# Patient Record
Sex: Male | Born: 1975 | Race: White | Hispanic: No | Marital: Single | Smoking: Current every day smoker
Health system: Southern US, Community
[De-identification: ages and names within clinical notes are randomized; demographics above are authoritative.]

---

## 1999-05-17 ENCOUNTER — Emergency Department (HOSPITAL_COMMUNITY): Admission: EM | Admit: 1999-05-17 | Discharge: 1999-05-17 | Payer: Self-pay | Admitting: *Deleted

## 1999-07-10 ENCOUNTER — Emergency Department (HOSPITAL_COMMUNITY): Admission: EM | Admit: 1999-07-10 | Discharge: 1999-07-11 | Payer: Self-pay | Admitting: Internal Medicine

## 1999-07-10 ENCOUNTER — Encounter: Payer: Self-pay | Admitting: Internal Medicine

## 2004-05-08 ENCOUNTER — Emergency Department (HOSPITAL_COMMUNITY): Admission: EM | Admit: 2004-05-08 | Discharge: 2004-05-08 | Payer: Self-pay | Admitting: Emergency Medicine

## 2016-07-31 ENCOUNTER — Encounter (HOSPITAL_COMMUNITY): Payer: Self-pay

## 2016-07-31 ENCOUNTER — Emergency Department (HOSPITAL_COMMUNITY)
Admission: EM | Admit: 2016-07-31 | Discharge: 2016-08-01 | Disposition: A | Discharge status: Home / Self Care | Payer: No Typology Code available for payment source | Attending: Emergency Medicine | Admitting: Emergency Medicine

## 2016-07-31 ENCOUNTER — Emergency Department (HOSPITAL_COMMUNITY): Payer: No Typology Code available for payment source

## 2016-07-31 DIAGNOSIS — S41011A Laceration without foreign body of right shoulder, initial encounter: Secondary | ICD-10-CM | POA: Insufficient documentation

## 2016-07-31 DIAGNOSIS — F17210 Nicotine dependence, cigarettes, uncomplicated: Secondary | ICD-10-CM | POA: Diagnosis not present

## 2016-07-31 DIAGNOSIS — S41111A Laceration without foreign body of right upper arm, initial encounter: Secondary | ICD-10-CM | POA: Insufficient documentation

## 2016-07-31 DIAGNOSIS — Y92410 Unspecified street and highway as the place of occurrence of the external cause: Secondary | ICD-10-CM | POA: Insufficient documentation

## 2016-07-31 DIAGNOSIS — S51812A Laceration without foreign body of left forearm, initial encounter: Secondary | ICD-10-CM | POA: Insufficient documentation

## 2016-07-31 DIAGNOSIS — Y999 Unspecified external cause status: Secondary | ICD-10-CM | POA: Insufficient documentation

## 2016-07-31 DIAGNOSIS — S41112A Laceration without foreign body of left upper arm, initial encounter: Secondary | ICD-10-CM

## 2016-07-31 DIAGNOSIS — Y9301 Activity, walking, marching and hiking: Secondary | ICD-10-CM | POA: Insufficient documentation

## 2016-07-31 MED ORDER — HYDROMORPHONE HCL 2 MG/ML IJ SOLN
1.0000 mg | INTRAMUSCULAR | Status: DC | PRN
  Administered 2016-07-31: 1 mg via INTRAVENOUS
  Filled 2016-07-31: qty 1

## 2016-07-31 MED ORDER — HYDROCODONE-ACETAMINOPHEN 5-325 MG PO TABS: 1.0000 | ORAL_TABLET | Freq: Four times a day (QID) | ORAL | 0 refills | Status: AC | PRN

## 2016-07-31 MED ORDER — CEPHALEXIN 500 MG PO CAPS: 500.0000 mg | ORAL_CAPSULE | Freq: Two times a day (BID) | ORAL | 0 refills | Status: AC

## 2016-07-31 MED ORDER — LIDOCAINE-EPINEPHRINE-TETRACAINE (LET) SOLUTION: 3.0000 mL | Freq: Once | NASAL | Status: DC

## 2016-07-31 MED ORDER — LIDOCAINE-EPINEPHRINE (PF) 2 %-1:200000 IJ SOLN: 10.0000 mL | Freq: Once | INTRAMUSCULAR | Status: DC

## 2016-07-31 MED ORDER — BUPIVACAINE-EPINEPHRINE (PF) 0.25% -1:200000 IJ SOLN
30.0000 mL | Freq: Once | INTRAMUSCULAR | Status: AC
  Administered 2016-07-31: 30 mL
  Filled 2016-07-31: qty 30

## 2016-07-31 MED ORDER — TETANUS-DIPHTH-ACELL PERTUSSIS 5-2.5-18.5 LF-MCG/0.5 IM SUSP
0.5000 mL | Freq: Once | INTRAMUSCULAR | Status: AC
  Administered 2016-07-31: 0.5 mL via INTRAMUSCULAR
  Filled 2016-07-31: qty 0.5

## 2016-07-31 MED ORDER — OXYCODONE-ACETAMINOPHEN 7.5-325 MG PO TABS
1.0000 | ORAL_TABLET | Freq: Once | ORAL | Status: AC
  Administered 2016-07-31: 1 via ORAL
  Filled 2016-07-31: qty 1

## 2016-07-31 MED ORDER — BACITRACIN ZINC 500 UNIT/GM EX OINT
TOPICAL_OINTMENT | Freq: Once | CUTANEOUS | Status: AC
  Administered 2016-07-31: 1 via TOPICAL
  Filled 2016-07-31: qty 2.7

## 2016-07-31 NOTE — Discharge Instructions (Signed)
Mr. Laurell JosephsBurke Please take your pain medication as needed Please take Keflex twice a day for 5 days Please watch for signs of infection and return if you have fever/chills or increased pain at site  Please go to an urgent care, primary care doctor or ER to have your stiches removed in 10 days
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
## 2017-08-26 IMAGING — CR DG ELBOW COMPLETE 3+V*R*
4 series · 4 of 4 positions shown · non-contrast
Comparison: None.

CLINICAL DATA: Trauma, laceration

EXAM:
RIGHT ELBOW - COMPLETE 3+ VIEW

[elbow ap]
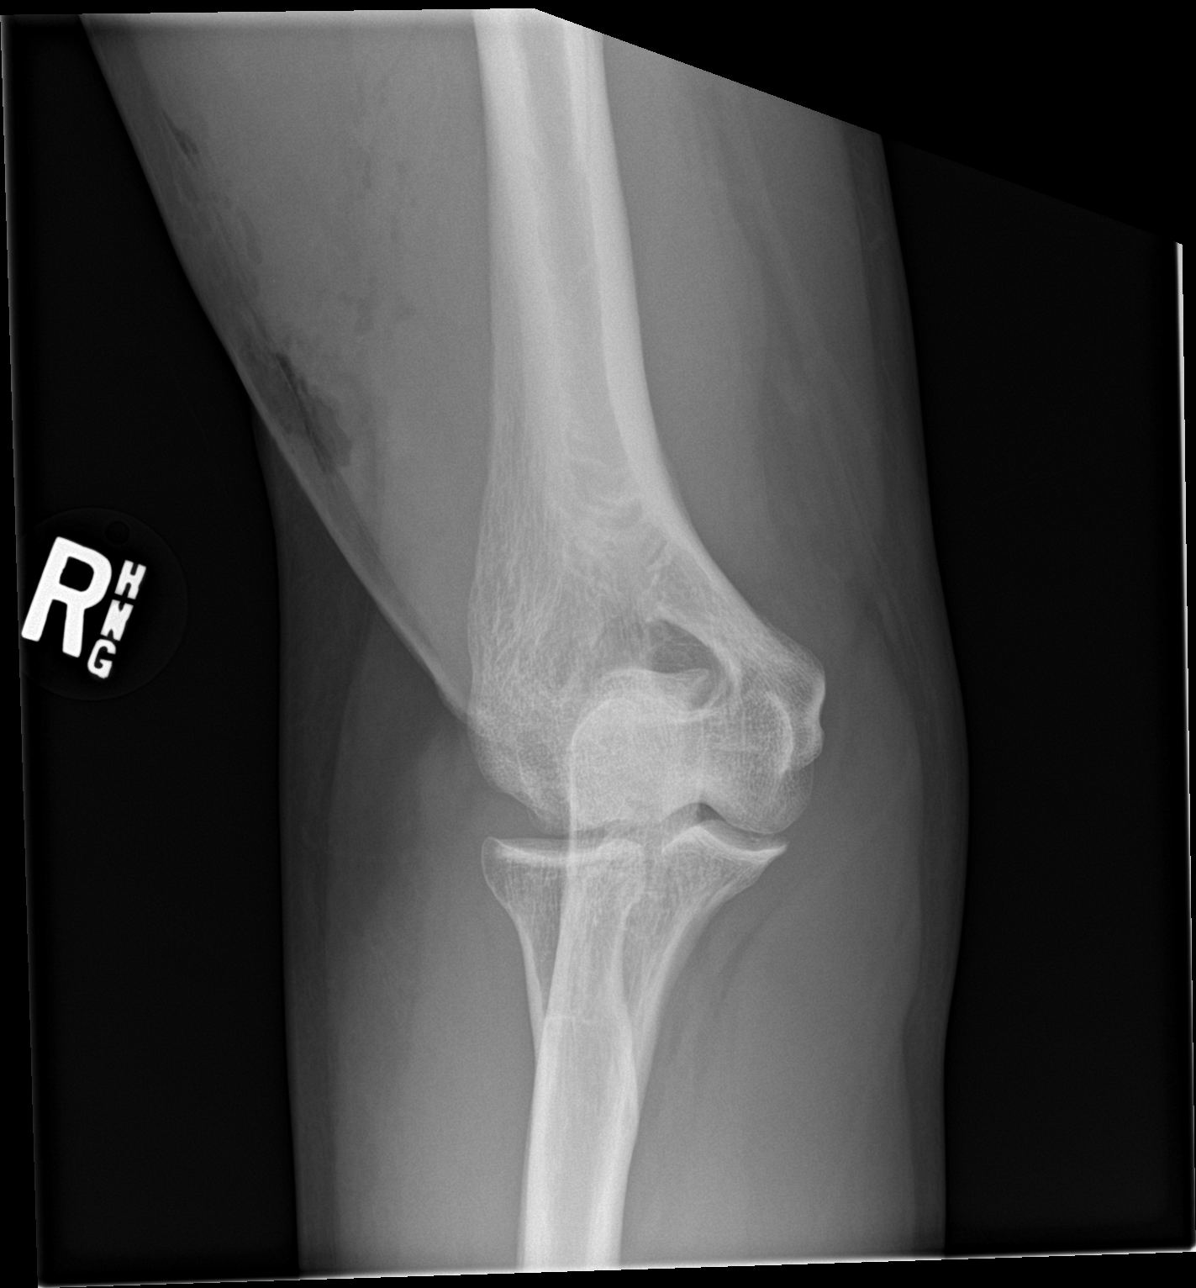

[elbow obl (1 of 2)]
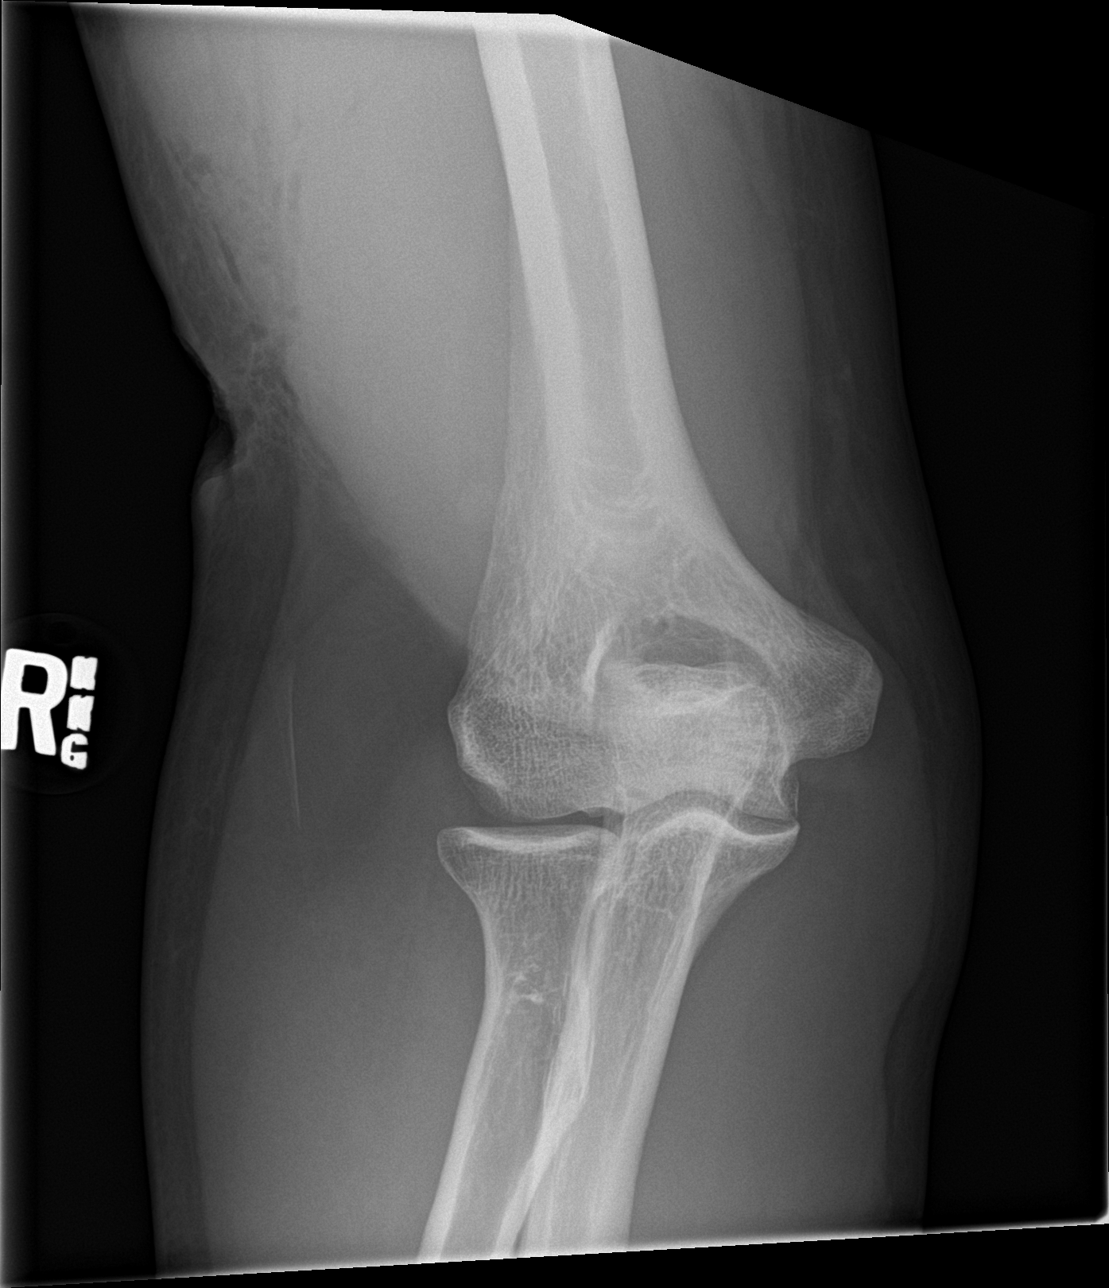

[elbow obl (2 of 2)]
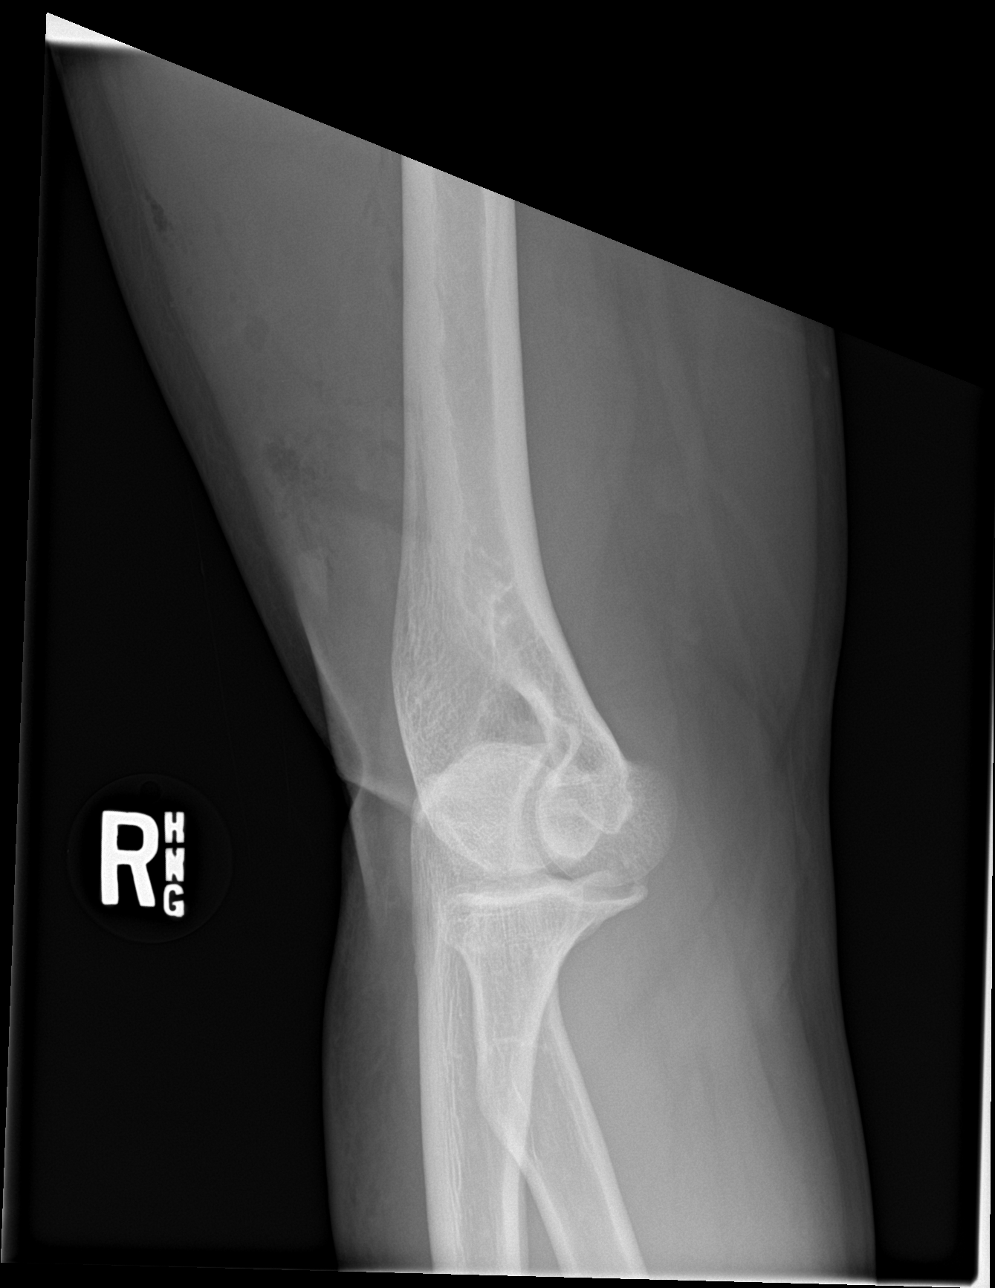

[elbow lat]
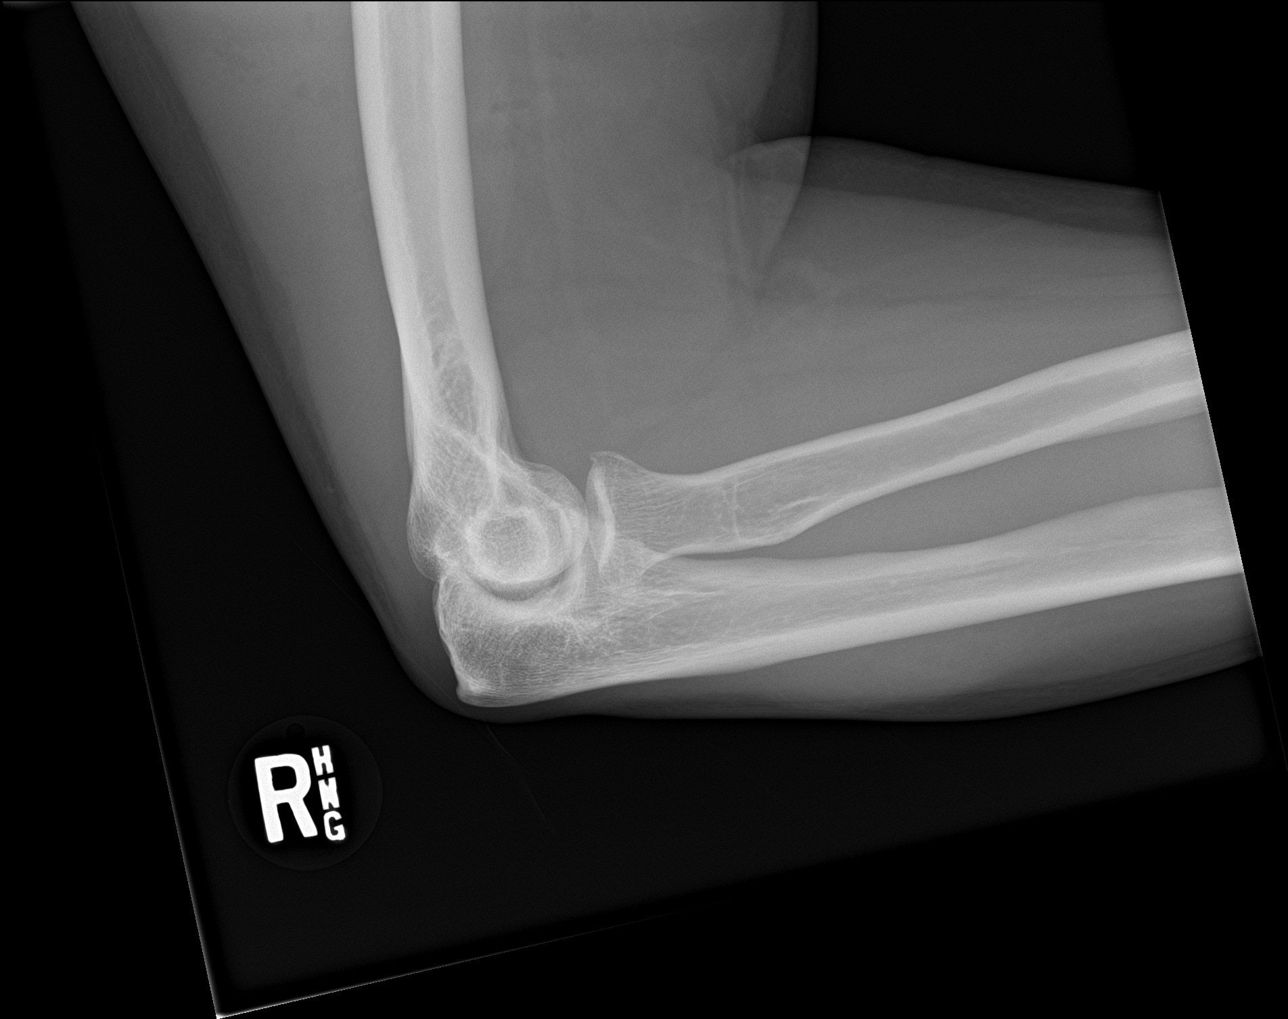

[4 of 4 positions shown; findings below may reference images not displayed]

FINDINGS: Soft tissue gas along the radial aspect of the distal upper arm. No
radiopaque foreign body. No significant elbow effusion. No acute
fracture or dislocation.
IMPRESSION: 1. No acute osseous abnormality
2. Soft tissue gas compatible with laceration involving the distal
upper arm.
